# Patient Record
Sex: Male | Born: 1955
Health system: Southern US, Community
[De-identification: ages and names within clinical notes are randomized; demographics above are authoritative.]

## PROBLEM LIST (undated history)

## (undated) DIAGNOSIS — G4733 Obstructive sleep apnea (adult) (pediatric): Secondary | ICD-10-CM

## (undated) DIAGNOSIS — R739 Hyperglycemia, unspecified: Secondary | ICD-10-CM

## (undated) DIAGNOSIS — E785 Hyperlipidemia, unspecified: Secondary | ICD-10-CM

## (undated) HISTORY — PX: TONSILLECTOMY AND ADENOIDECTOMY: SHX28

## (undated) HISTORY — PX: OTHER SURGICAL HISTORY: SHX169

## (undated) HISTORY — DX: Hyperlipidemia, unspecified: E78.5

## (undated) HISTORY — DX: Hyperglycemia, unspecified: R73.9

## (undated) HISTORY — DX: Obstructive sleep apnea (adult) (pediatric): G47.33

---

## 1998-02-27 ENCOUNTER — Emergency Department (HOSPITAL_COMMUNITY): Admission: EM | Admit: 1998-02-27 | Discharge: 1998-02-27 | Payer: Self-pay | Admitting: Family Medicine

## 2005-11-04 ENCOUNTER — Ambulatory Visit (HOSPITAL_COMMUNITY): Admission: RE | Admit: 2005-11-04 | Discharge: 2005-11-04 | Payer: Self-pay | Admitting: Orthopedic Surgery

## 2010-09-21 ENCOUNTER — Ambulatory Visit
Admission: RE | Admit: 2010-09-21 | Discharge: 2010-09-21 | Disposition: A | Discharge status: Home / Self Care | Payer: Self-pay | Source: Ambulatory Visit | Attending: Family Medicine | Admitting: Family Medicine

## 2010-09-21 ENCOUNTER — Other Ambulatory Visit: Payer: Self-pay | Admitting: Family Medicine

## 2010-09-21 DIAGNOSIS — N5089 Other specified disorders of the male genital organs: Secondary | ICD-10-CM

## 2011-04-25 ENCOUNTER — Ambulatory Visit (INDEPENDENT_AMBULATORY_CARE_PROVIDER_SITE_OTHER): Payer: 59 | Admitting: Sports Medicine

## 2011-04-25 ENCOUNTER — Encounter: Payer: Self-pay | Admitting: Sports Medicine

## 2011-04-25 VITALS — HR 73 | Ht 79.0 in | Wt 256.0 lb

## 2011-04-25 DIAGNOSIS — M79671 Pain in right foot: Secondary | ICD-10-CM | POA: Insufficient documentation

## 2011-04-25 DIAGNOSIS — M79609 Pain in unspecified limb: Secondary | ICD-10-CM

## 2011-04-25 MED ORDER — MELOXICAM 15 MG PO TABS: ORAL_TABLET | ORAL | Status: AC

## 2011-04-25 NOTE — Progress Notes (Signed)
  Subjective:    Patient ID: Bryan Medina, male    DOB: July 29, 1955, 55 y.o.   MRN: [id number]  HPI Right foot pain: Localized to the lateral midfoot from the plantar aspect. Noted is approximately 1 month ago while walking immediately felt a pain in this location, was able to continue walking but only with significant pain. Has been icing it as recommended by his PCP, and using ibuprofen on and off.  Walking approximately 2 miles per day. No snaps, pops, missteps noted.  Past medical history: None Past surgical history: None Social history: No alcohol, tobacco, or drugs. As a sales rep, on his feet a lot, and traveling a lot. Family history: Positive for diabetes negative for heart disease or high blood pressure. Allergies: No known drug allergies. Medications: None  Review of Systems    no fevers, chills, night sweats, weight loss. Objective:   Physical Exam Gen: Well-developed well-nourished Caucasian male in no acute distress. Neuro: Alert oriented x3, extraocular muscles intact. Skin: Warm and dry. Respiratory: Not using accessory muscles. Musculoskeletal: Cavus foot bilaterally, transverse arch breakdown bilaterally, abnormal callus noted on second through fourth metatarsal heads. Tender to palpation on the lateral aspect of plantar midfoot. No pain with small or great toe plantar flexion or dorsiflexion. Posterior tib function intact. No pain along the peroneals, or with resisted eversion. Negative anterior drawer, negative Kleiger, negative squeeze.  Leg lengths equal and 110 cm bilaterally.     Assessment & Plan:

## 2011-04-25 NOTE — Assessment & Plan Note (Addendum)
Suspect strain of one of the intrinsic foot muscles, quadratus plantae versus abductor digiti minimi. Continue ice 20 minutes 3 times a day. Meloxicam 15 mg daily. We've given him a sports insole, size 14, with some mediums metatarsal pads, lateral heel wedge on the right side. If he does well with this we'll bring him back and make custom orthotics. He will come back to see [place] in one month.

## 2011-05-23 ENCOUNTER — Ambulatory Visit: Payer: Self-pay | Admitting: Sports Medicine

## 2012-03-26 ENCOUNTER — Ambulatory Visit (INDEPENDENT_AMBULATORY_CARE_PROVIDER_SITE_OTHER): Payer: 59 | Admitting: Sports Medicine

## 2012-03-26 VITALS — BP 114/80 | Ht 79.0 in | Wt 256.0 lb

## 2012-03-26 DIAGNOSIS — M25579 Pain in unspecified ankle and joints of unspecified foot: Secondary | ICD-10-CM

## 2012-03-26 DIAGNOSIS — M79671 Pain in right foot: Secondary | ICD-10-CM

## 2012-03-26 DIAGNOSIS — M25571 Pain in right ankle and joints of right foot: Secondary | ICD-10-CM

## 2012-03-26 DIAGNOSIS — M79609 Pain in unspecified limb: Secondary | ICD-10-CM

## 2012-03-26 NOTE — Patient Instructions (Signed)
Please do suggested exercises daily for your ankle  We will contact [facility] about getting your ankle sleeve  Please make a follow up for custom orthotics- please bring walking shoes  Please get x-ray of your rt ankle  Thank you for seeing [place] today!

## 2012-03-26 NOTE — Assessment & Plan Note (Signed)
Less pure foot pain now More localized to lat ankle

## 2012-03-26 NOTE — Progress Notes (Signed)
Patient ID: Dontez Cioffi, male   DOB: [birth year], 56 y.o.   MRN: [id number]  F/U of RT ankle/foot pain  Saw Dr T last year Came up last sept Pain happened with a turn while walking Pain came on immediately Hard to finish walking and already swollen when home Swollen since last year  Given sports insoles Mobic Tx did not really help No exercises or compression Used some ice    Physical Exam: NAD  Lt ankle: Increased inversion laxity on lt Slightly positive drawer  Rt ankle: Swelling along ant portion of lateral malleolus No tenderness to percussion of malleolus Less laxity on inversion or drawer on rt No ttp AT   Slight hammering of toes bilat with loss of transverse arch High long arch bilat Splaying b/t toes 2-3 on lt piezogenic papules bilat  [place] Irregularity along ant inf lat malleolus RT Probable loose body or spur in area below lat mall No ankle effusion noted Possible lat ant talar dome irregularity Tendons are intact

## 2012-03-26 NOTE — Assessment & Plan Note (Signed)
Trial on compression - will need to order XL from [facility]  Work on easy ROM  Icing at end of day  May benefit from orthotics if not enough support  Xray today to assess for DJD  Reck after 1 month of treatment plan

## 2012-04-15 ENCOUNTER — Ambulatory Visit
Admission: RE | Admit: 2012-04-15 | Discharge: 2012-04-15 | Disposition: A | Discharge status: Home / Self Care | Payer: 59 | Source: Ambulatory Visit | Attending: Sports Medicine | Admitting: Sports Medicine

## 2012-04-15 DIAGNOSIS — M25571 Pain in right ankle and joints of right foot: Secondary | ICD-10-CM

## 2012-04-17 ENCOUNTER — Telehealth: Payer: Self-pay | Admitting: *Deleted

## 2012-04-17 NOTE — Telephone Encounter (Signed)
Spoke with pt- re: his x-rays.  Advised him to schedule f/u if ankle is still painful.  Pt states he will call back to schedule.

## 2012-04-17 NOTE — Telephone Encounter (Signed)
Message copied by Mora Bellman on Wed Apr 17, 2012  4:46 PM ------      Message from: Enid Baas      Created: Wed Apr 17, 2012  4:03 PM       XRay did not show arthritis but an old avulsion fracture. Let him know that based on his XRay and his [place] in our office I would be happy to see him back if the ankle is still bothering him as much.            KBF      ----- Message -----         From: Lizbeth Bark         Sent: 04/17/2012   1:23 PM           To: Enid Baas, MD            He doesn't have one      ----- Message -----         From: Enid Baas, MD         Sent: 04/17/2012  12:41 PM           To: Melanie L Ceresi            Does he have appt?

## 2013-04-03 IMAGING — CR DG ANKLE 2V *R*
2 series · 2 of 2 positions shown · non-contrast
Comparison: None.

CLINICAL DATA: Continued discomfort after a  right ankle injury 1
year ago.

RIGHT ANKLE - 2 VIEW

[view not recorded (1 of 2)]
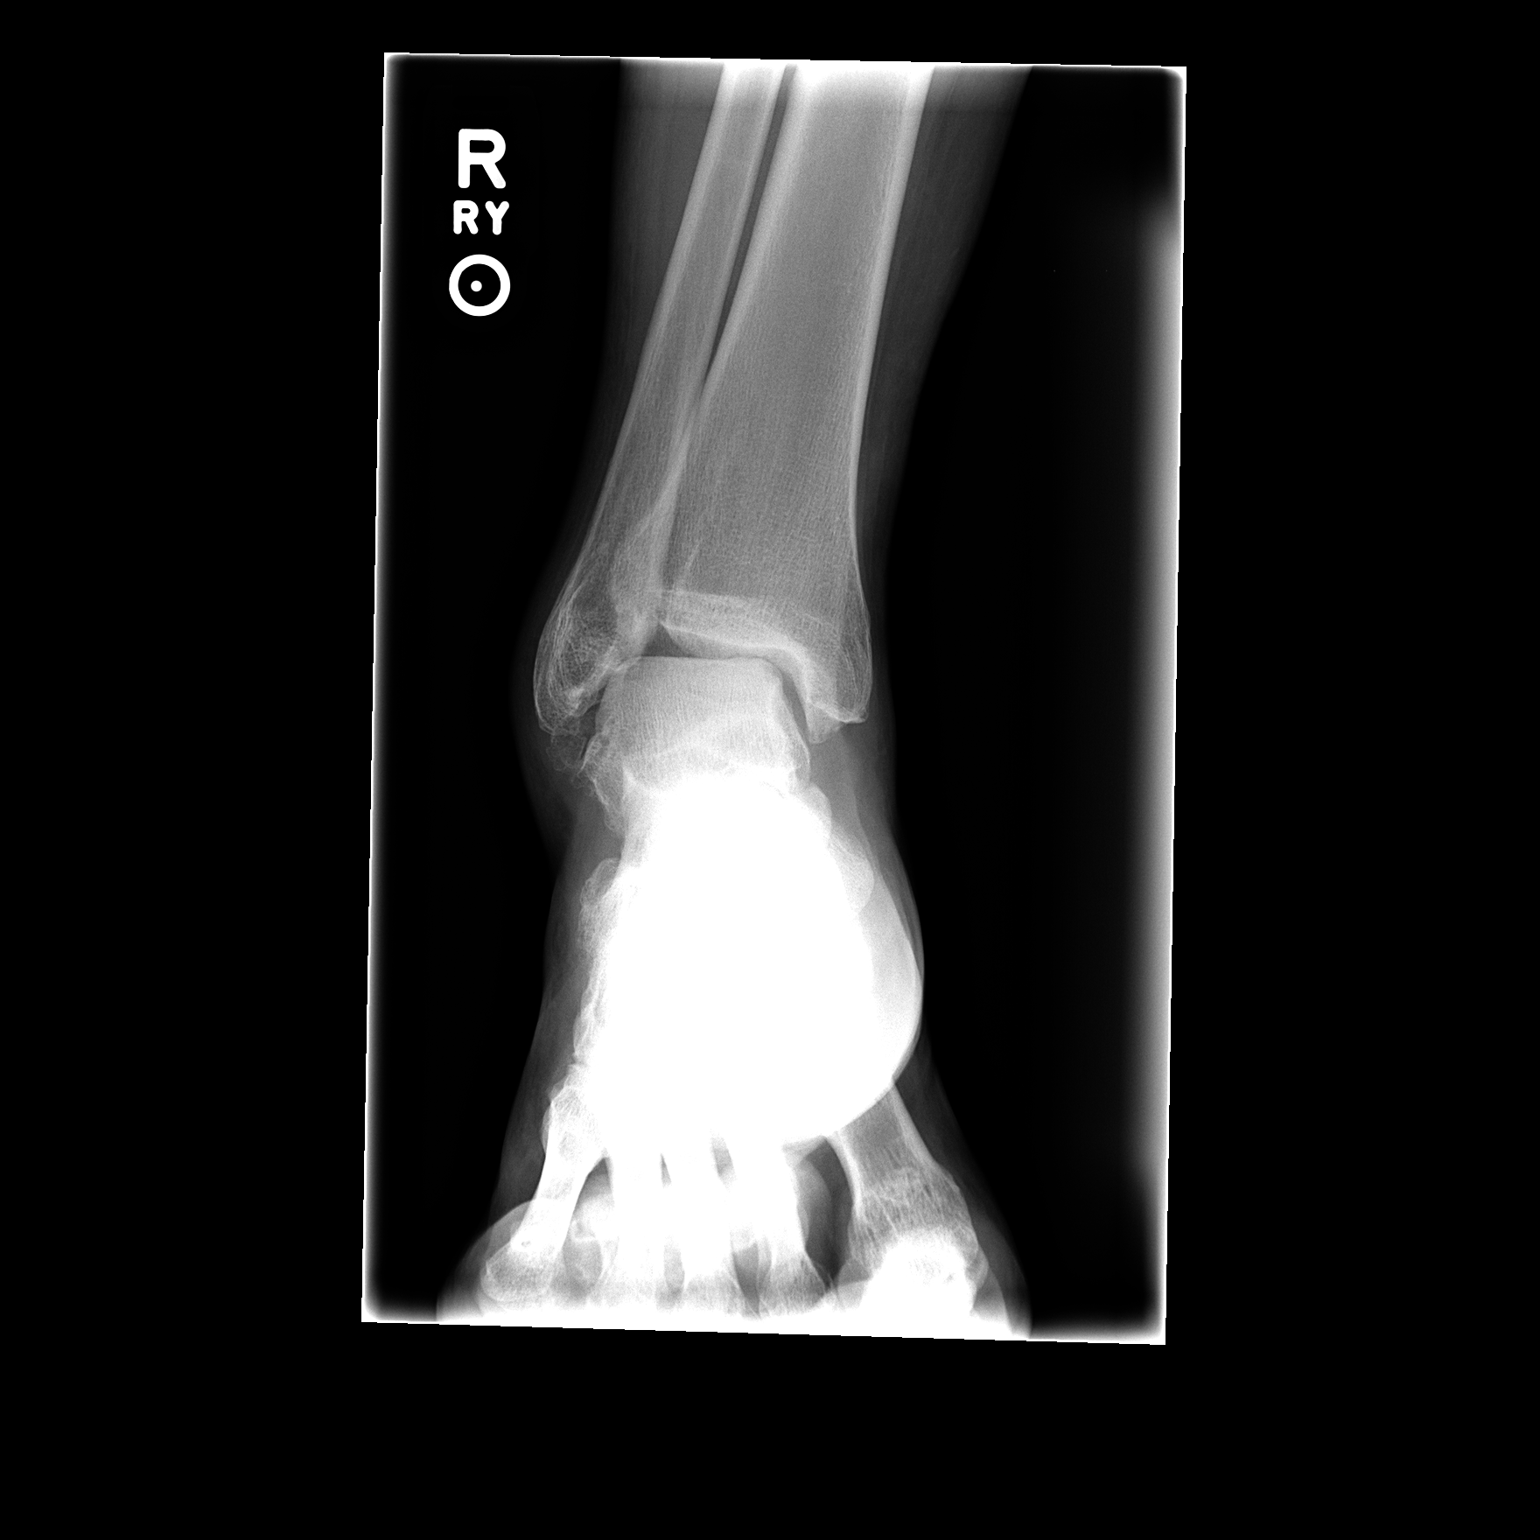

[view not recorded (2 of 2)]
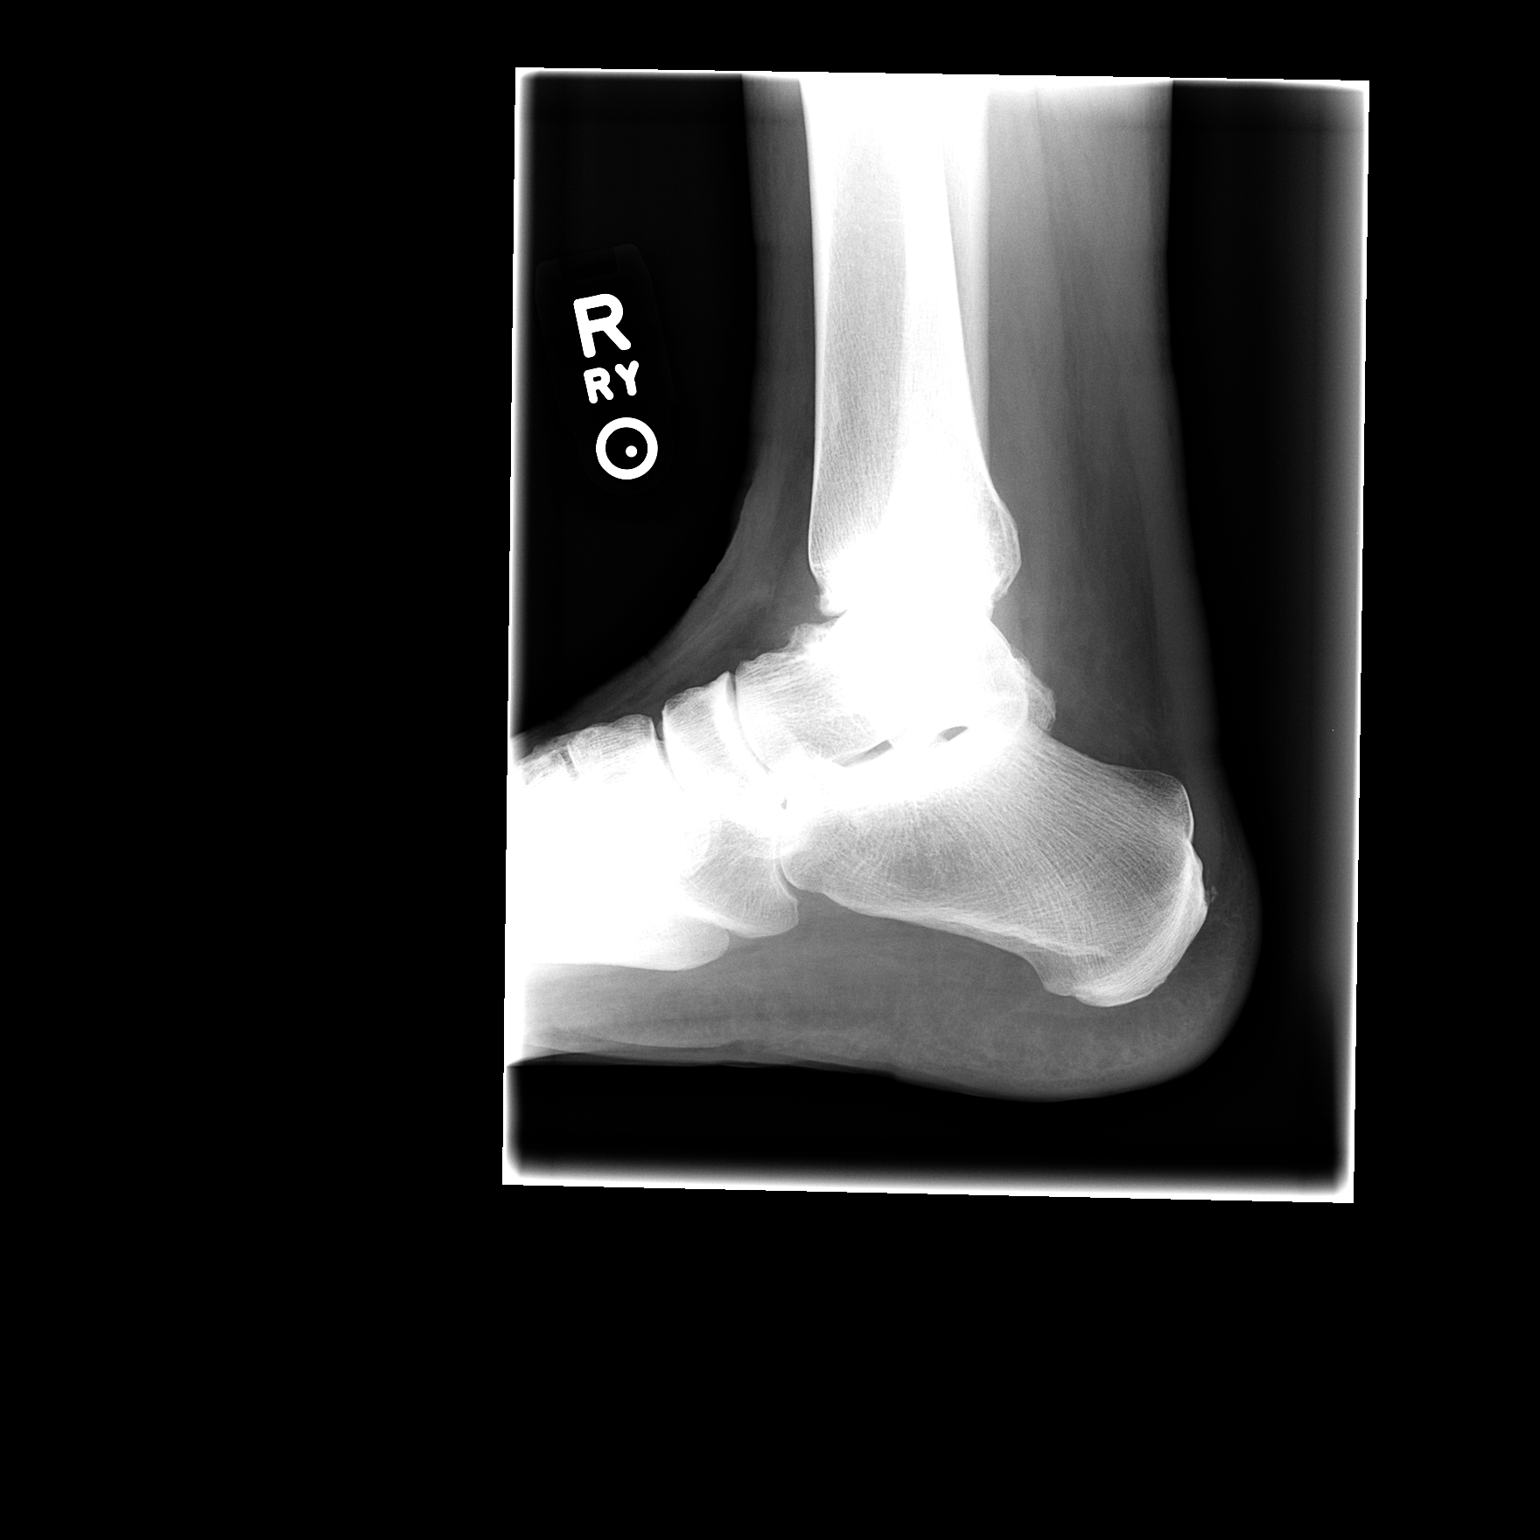

[2 of 2 positions shown; findings below may reference images not displayed]

FINDINGS: There are small rounded densities inferior to the lateral
malleolus suggesting a remote avulsion injury. There is a chronic
ligamentous injury resulting in widening of the ankle mortise
laterally and narrowing medially.  There is slight tilting of the
tibia medially on the talus.  No acute fracture is seen.  Mild
lateral soft tissue swelling is noted.
IMPRESSION: Findings consistent with a chronic  ligamentous injury to the ankle
with  lateral ankle mortise widening.  Suspect old avulsion injury
lateral malleolus.

## 2017-09-25 ENCOUNTER — Encounter: Payer: Self-pay | Admitting: Sports Medicine

## 2017-09-25 ENCOUNTER — Telehealth: Payer: Self-pay | Admitting: *Deleted

## 2017-09-25 ENCOUNTER — Ambulatory Visit (INDEPENDENT_AMBULATORY_CARE_PROVIDER_SITE_OTHER): Payer: 59 | Admitting: Sports Medicine

## 2017-09-25 VITALS — BP 124/82 | HR 70

## 2017-09-25 DIAGNOSIS — M79671 Pain in right foot: Secondary | ICD-10-CM

## 2017-09-25 DIAGNOSIS — M25571 Pain in right ankle and joints of right foot: Secondary | ICD-10-CM | POA: Diagnosis not present

## 2017-09-25 DIAGNOSIS — T8131XA Disruption of external operation (surgical) wound, not elsewhere classified, initial encounter: Secondary | ICD-10-CM | POA: Diagnosis not present

## 2017-09-25 DIAGNOSIS — Z9889 Other specified postprocedural states: Secondary | ICD-10-CM | POA: Diagnosis not present

## 2017-09-25 DIAGNOSIS — L97511 Non-pressure chronic ulcer of other part of right foot limited to breakdown of skin: Secondary | ICD-10-CM

## 2017-09-25 MED ORDER — COLLAGENASE 250 UNIT/GM EX OINT: 1.0000 "application " | TOPICAL_OINTMENT | Freq: Every day | CUTANEOUS | 5 refills | Status: AC

## 2017-09-25 NOTE — Telephone Encounter (Signed)
-----   Message from Landis Martins, [place] sent at 09/25/2017  9:36 AM EDT ----- Regarding: Oren Section for right foot/ankle ulceration measures 2x1cm. Call patient to let him know when the medication has been sent to his pharmacy Thanks Dr. Chauncey Cruel

## 2017-09-25 NOTE — Telephone Encounter (Signed)
Faxed required form and demographics to [facility]. 

## 2017-09-25 NOTE — Patient Instructions (Signed)
Cleanse wound with saline or wound wash (can be purchased over the counter) then apply Santyl and cover with guaze and paper tape or a bandaid dressing daily May leave open to air at night Refrain from getting it wet Follow up with Orthopedic surgeon

## 2017-09-25 NOTE — Progress Notes (Signed)
Subjective: Bryan Medina is a 62 y.o. male patient seen in office for evaluation of ulceration of the Right. Patient has a history of total ankle replacement performed by Dr. Debby Bud on 08/16/17. Reports after cast was removed had wound and later the sutures were removed with last visit on 3/20. Patient reports that he told his PCP Dr. Unk Lightning about the wound who referred him to me for evaluation of the wound at his ankle.   Patient is changing the dressing using antibiotic cream at home. States that he finished PO antibiotic last Tuesday. Denies nausea/fever/vomiting/chills/night sweats/shortness of breath/pain. Patient has no other pedal complaints at this time.  Review of Systems  Skin:       Wound right ankle  All other systems reviewed and are negative.  Patient Active Problem List   Diagnosis Date Noted  . Ankle pain, right 03/26/2012  . Right foot pain 04/25/2011   Current Outpatient Medications on File Prior to Visit  Medication Sig Dispense Refill  . meloxicam (MOBIC) 15 MG tablet One tab PO qAM for 2 weeks, then prn. 30 tablet 2   No current facility-administered medications on file prior to visit.    Allergies  Allergen Reactions  . No Known Allergies     No results found for this or any previous visit (from the past 2160 hour(s)).  Objective: There were no vitals filed for this visit.  General: Patient is awake, alert, oriented x 3 and in no acute distress.  Dermatology: Skin is warm and dry bilateral with a partial thickness ulceration present R anterior ankle. Ulceration measures 2cm x 1cm x 0 cm. There is a scar at borders since wound is within surgical incision line with a fibrotic base. The ulceration does not probe to bone. No tendon or deep tissue exposure. There is no malodor, no active drainage, no erythema, no edema. No acute signs of infection. Mild blanchable skin changes and scar contracture consistent with post op status at right ankle.    Vascular: Dorsalis  Pedis pulse = 1/4 Bilateral,  Posterior Tibial pulse = 1/4 Bilateral,  Capillary Fill Time < 5 seconds  Neurologic: Gross sensation present via light touch.  Musculosketal: No Pain with palpation to ulcerated area at surgical incision on right anterior ankle.  .No results for input(s): GRAMSTAIN, LABORGA in the last 8760 hours.  Assessment and Plan:  Problem List Items Addressed This Visit      Other   Right foot pain   Ankle pain, right    Other Visit Diagnoses    Foot ulceration, right, limited to breakdown of skin ([place])    -  Primary   Wound disruption, post-op, skin, initial encounter       History of arthroplasty of right ankle           -Examined patient and discussed the progression of the wound and treatment alternatives. Educated patient on post surgical wound  -Xray results reviewed from Dr. Chevis Pretty chart notes  - Cleansed ulceration   -Applied antibiotic cream and dry sterile dressing and instructed patient to continue with daily dressings at home consisting of santyl that was ordered for the fibrotic wound; Instructed to start using santyl in place of antibiotic cream covered with dry dressing; recommend to refrain from getting wound wet; Shower with protector and may leave open to air at night - Advised patient to go to the ER or return to office if the wound worsens or if constitutional symptoms are present. -Recommend patient to keep  follow up with Ortho doctor for continued post op care; Re-consult if other issues or problems arise.  Landis Martins, DPM

## 2017-09-30 ENCOUNTER — Encounter: Payer: Self-pay | Admitting: Sports Medicine

## 2019-01-22 DIAGNOSIS — G4733 Obstructive sleep apnea (adult) (pediatric): Secondary | ICD-10-CM | POA: Diagnosis not present

## 2019-01-24 DIAGNOSIS — R7301 Impaired fasting glucose: Secondary | ICD-10-CM | POA: Diagnosis not present

## 2019-01-24 DIAGNOSIS — E782 Mixed hyperlipidemia: Secondary | ICD-10-CM | POA: Diagnosis not present

## 2019-01-24 DIAGNOSIS — Z125 Encounter for screening for malignant neoplasm of prostate: Secondary | ICD-10-CM | POA: Diagnosis not present

## 2019-01-24 DIAGNOSIS — R5383 Other fatigue: Secondary | ICD-10-CM | POA: Diagnosis not present

## 2019-01-29 DIAGNOSIS — R102 Pelvic and perineal pain: Secondary | ICD-10-CM | POA: Diagnosis not present

## 2019-01-29 DIAGNOSIS — Z125 Encounter for screening for malignant neoplasm of prostate: Secondary | ICD-10-CM | POA: Diagnosis not present

## 2019-01-29 DIAGNOSIS — Z0000 Encounter for general adult medical examination without abnormal findings: Secondary | ICD-10-CM | POA: Diagnosis not present

## 2019-01-29 DIAGNOSIS — Z23 Encounter for immunization: Secondary | ICD-10-CM | POA: Diagnosis not present

## 2019-01-29 DIAGNOSIS — Z7189 Other specified counseling: Secondary | ICD-10-CM | POA: Diagnosis not present

## 2019-01-29 DIAGNOSIS — R7301 Impaired fasting glucose: Secondary | ICD-10-CM | POA: Diagnosis not present

## 2019-01-29 DIAGNOSIS — E782 Mixed hyperlipidemia: Secondary | ICD-10-CM | POA: Diagnosis not present

## 2019-02-14 DIAGNOSIS — G4733 Obstructive sleep apnea (adult) (pediatric): Secondary | ICD-10-CM | POA: Diagnosis not present

## 2019-02-15 DIAGNOSIS — Z20828 Contact with and (suspected) exposure to other viral communicable diseases: Secondary | ICD-10-CM | POA: Diagnosis not present

## 2019-02-22 DIAGNOSIS — G4733 Obstructive sleep apnea (adult) (pediatric): Secondary | ICD-10-CM | POA: Diagnosis not present

## 2019-03-25 DIAGNOSIS — G4733 Obstructive sleep apnea (adult) (pediatric): Secondary | ICD-10-CM | POA: Diagnosis not present

## 2019-04-23 DIAGNOSIS — R69 Illness, unspecified: Secondary | ICD-10-CM | POA: Diagnosis not present

## 2019-05-15 DIAGNOSIS — G4733 Obstructive sleep apnea (adult) (pediatric): Secondary | ICD-10-CM | POA: Diagnosis not present

## 2019-05-26 DIAGNOSIS — R69 Illness, unspecified: Secondary | ICD-10-CM | POA: Diagnosis not present

## 2019-06-14 DIAGNOSIS — G4733 Obstructive sleep apnea (adult) (pediatric): Secondary | ICD-10-CM | POA: Diagnosis not present

## 2019-06-25 DIAGNOSIS — H2513 Age-related nuclear cataract, bilateral: Secondary | ICD-10-CM | POA: Diagnosis not present

## 2019-06-25 DIAGNOSIS — H52203 Unspecified astigmatism, bilateral: Secondary | ICD-10-CM | POA: Diagnosis not present

## 2019-07-15 DIAGNOSIS — G4733 Obstructive sleep apnea (adult) (pediatric): Secondary | ICD-10-CM | POA: Diagnosis not present

## 2019-08-18 DIAGNOSIS — M19071 Primary osteoarthritis, right ankle and foot: Secondary | ICD-10-CM | POA: Diagnosis not present

## 2019-08-20 DIAGNOSIS — G4733 Obstructive sleep apnea (adult) (pediatric): Secondary | ICD-10-CM | POA: Diagnosis not present

## 2019-09-03 DIAGNOSIS — R102 Pelvic and perineal pain: Secondary | ICD-10-CM | POA: Diagnosis not present

## 2019-09-03 DIAGNOSIS — N401 Enlarged prostate with lower urinary tract symptoms: Secondary | ICD-10-CM | POA: Diagnosis not present

## 2019-09-03 DIAGNOSIS — R3912 Poor urinary stream: Secondary | ICD-10-CM | POA: Diagnosis not present

## 2019-11-04 DIAGNOSIS — R69 Illness, unspecified: Secondary | ICD-10-CM | POA: Diagnosis not present

## 2020-02-02 DIAGNOSIS — G4733 Obstructive sleep apnea (adult) (pediatric): Secondary | ICD-10-CM | POA: Diagnosis not present

## 2020-02-02 DIAGNOSIS — E782 Mixed hyperlipidemia: Secondary | ICD-10-CM | POA: Diagnosis not present

## 2020-02-02 DIAGNOSIS — Z1159 Encounter for screening for other viral diseases: Secondary | ICD-10-CM | POA: Diagnosis not present

## 2020-02-02 DIAGNOSIS — Z0000 Encounter for general adult medical examination without abnormal findings: Secondary | ICD-10-CM | POA: Diagnosis not present

## 2020-02-02 DIAGNOSIS — R0789 Other chest pain: Secondary | ICD-10-CM | POA: Diagnosis not present

## 2020-02-02 DIAGNOSIS — R7309 Other abnormal glucose: Secondary | ICD-10-CM | POA: Diagnosis not present

## 2020-02-02 DIAGNOSIS — Z125 Encounter for screening for malignant neoplasm of prostate: Secondary | ICD-10-CM | POA: Diagnosis not present

## 2020-03-10 DIAGNOSIS — R3912 Poor urinary stream: Secondary | ICD-10-CM | POA: Diagnosis not present

## 2020-03-10 DIAGNOSIS — N401 Enlarged prostate with lower urinary tract symptoms: Secondary | ICD-10-CM | POA: Diagnosis not present

## 2020-03-17 DIAGNOSIS — D2271 Melanocytic nevi of right lower limb, including hip: Secondary | ICD-10-CM | POA: Diagnosis not present

## 2020-03-17 DIAGNOSIS — L570 Actinic keratosis: Secondary | ICD-10-CM | POA: Diagnosis not present

## 2020-03-17 DIAGNOSIS — D2272 Melanocytic nevi of left lower limb, including hip: Secondary | ICD-10-CM | POA: Diagnosis not present

## 2020-03-17 DIAGNOSIS — D2261 Melanocytic nevi of right upper limb, including shoulder: Secondary | ICD-10-CM | POA: Diagnosis not present

## 2020-03-17 DIAGNOSIS — D2262 Melanocytic nevi of left upper limb, including shoulder: Secondary | ICD-10-CM | POA: Diagnosis not present

## 2020-03-17 DIAGNOSIS — L918 Other hypertrophic disorders of the skin: Secondary | ICD-10-CM | POA: Diagnosis not present

## 2020-03-17 DIAGNOSIS — D225 Melanocytic nevi of trunk: Secondary | ICD-10-CM | POA: Diagnosis not present

## 2020-03-17 DIAGNOSIS — L821 Other seborrheic keratosis: Secondary | ICD-10-CM | POA: Diagnosis not present

## 2020-03-17 DIAGNOSIS — D485 Neoplasm of uncertain behavior of skin: Secondary | ICD-10-CM | POA: Diagnosis not present

## 2020-03-30 NOTE — Progress Notes (Signed)
Cardiology Office Note   Date:  04/01/2020   ID:  Bryan Medina, DOB Aug 08, 1955, MRN [id number]  PCP:  Mayra Neer, MD  Cardiologist:   No primary care provider on file. Referring:  Mayra Neer, MD  Chief Complaint  Patient presents with  . Chest Pain      History of Present Illness: Bryan Medina is a 64 y.o. male who was sent by Mayra Neer, MD for evaluation of chest pain.   He has no past cardiac history but he does have risk factors.     He has been having some chest discomfort.  This happens sporadically.  It might happen at rest.  It might happen with activity such as moving his youngest daughter into a new house.  He says it is more of a fleeting discomfort.  He points to his left upper chest.  It is mild.  There is no radiation to his arm or to his jaw.  It comes and goes spontaneously.  It seems to be a somewhat stable pattern but he had significant cardiovascular risk factors as below.  He does not have associated nausea vomiting or diaphoresis.  He does not have any significant shortness of breath, PND or orthopnea.  He has some water aerobics and some walking for exercise.  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  Past Medical History:  Diagnosis Date  . Dyslipidemia   . Hyperglycemia   . Obstructive sleep apnea    Uses CPAP    Past Surgical History:  Procedure Laterality Date  . Right ankle replacement    . TONSILLECTOMY AND ADENOIDECTOMY       Current Outpatient Medications  Medication Sig Dispense Refill  . collagenase (SANTYL) ointment Apply 1 application topically daily. 15 g 5  . meloxicam (MOBIC) 15 MG tablet One tab PO qAM for 2 weeks, then prn. 30 tablet 2   No current facility-administered medications for this visit.    Allergies:   No known allergies    Social History:  The patient  reports that he has never smoked. He has never  used smokeless tobacco.   Family History:  The patient's family history includes Diabetes in his mother.   No early heart disease in the family.     ROS:  Please see the history of present illness.   Otherwise, review of systems are positive for none.   All other systems are reviewed and negative.    PHYSICAL EXAM: VS:  BP 108/68 (BP Location: Right Arm)   Pulse 77   Ht 6\' 7"  (2.007 m)   Wt 263 lb (119.3 kg)   SpO2 97%   BMI 29.63 kg/m  , BMI Body mass index is 29.63 kg/m. GENERAL:  Well appearing HEENT:  Pupils equal round and reactive, fundi not visualized, oral mucosa unremarkable NECK:  No jugular venous distention, waveform within normal limits, carotid upstroke brisk and symmetric, no bruits, no thyromegaly LYMPHATICS:  No cervical, inguinal adenopathy LUNGS:  Clear to auscultation bilaterally BACK:  No CVA tenderness CHEST:  Unremarkable HEART:  PMI not displaced or sustained,S1 and S2 within normal limits, no S3, no S4, no clicks, no rubs, no murmurs ABD:  Flat, positive bowel sounds normal in frequency in pitch, no bruits, no rebound, no guarding, no midline pulsatile mass, no hepatomegaly, no splenomegaly EXT:  2 plus pulses throughout, no edema, no cyanosis  no clubbing SKIN:  No rashes no nodules NEURO:  Cranial nerves II through XII grossly intact, motor grossly intact throughout PSYCH:  Cognitively intact, oriented to person place and time    EKG:  EKG is not ordered today. The ekg ordered 8-21 demonstrates normal sinus rhythm, rate 58, axis within normal limits, intervals within normal limits, no acute ST-T wave changes.   Recent Labs: No results found for requested labs within last 8760 hours.    Lipid Panel No results found for: CHOL, TRIG, HDL, CHOLHDL, VLDL, LDLCALC, LDLDIRECT    Wt Readings from Last 3 Encounters:  04/01/20 263 lb (119.3 kg)  03/26/12 256 lb (116.1 kg)  04/25/11 256 lb (116.1 kg)      Other studies Reviewed: Additional studies/  records that were reviewed today include: Labs, EKG. Review of the above records demonstrates:  Please see elsewhere in the note.     ASSESSMENT AND PLAN:  CHEST PAIN:   The patient's chest pain is somewhat atypical.  I like to start with a coronary calcium score and a POET (Plain Old Exercise Treadmill).  This will allow me to set goals of therapy and screen for obstructive coronary disease.  If he has a negative treadmill test and no further testing would be indicated but he might need further therapy for risk reduction pending his calcium score.  HTN: His blood pressure is typically well controlled.  No change in therapy.  DYSLIPIDEMIA: LDL was 146.  Total cholesterol 219.  Triglycerides 114.  I will use the MESA score for goals of therapy.  COVID EDUCATION: He has had the virus.  We talked at length about the benefits of the vaccine.  Current medicines are reviewed at length with the patient today.  The patient does not have concerns regarding medicines.  The following changes have been made:  no change  Labs/ tests ordered today include:   Orders Placed This Encounter  Procedures  . CT CARDIAC SCORING  . EXERCISE TOLERANCE TEST (ETT)  . EKG 12-Lead     Disposition:   FU with me as needed   Signed, Minus Breeding, MD  04/01/2020 12:39 PM    [place]

## 2020-04-01 ENCOUNTER — Other Ambulatory Visit: Payer: Self-pay

## 2020-04-01 ENCOUNTER — Encounter: Payer: Self-pay | Admitting: Cardiology

## 2020-04-01 ENCOUNTER — Ambulatory Visit (INDEPENDENT_AMBULATORY_CARE_PROVIDER_SITE_OTHER): Payer: Medicare HMO | Admitting: Cardiology

## 2020-04-01 VITALS — BP 108/68 | HR 77 | Ht 79.0 in | Wt 263.0 lb

## 2020-04-01 DIAGNOSIS — R079 Chest pain, unspecified: Secondary | ICD-10-CM | POA: Diagnosis not present

## 2020-04-01 DIAGNOSIS — E785 Hyperlipidemia, unspecified: Secondary | ICD-10-CM

## 2020-04-01 NOTE — Patient Instructions (Addendum)
Medication Instructions:  Your physician recommends that you continue on your current medications as directed. Please refer to the Current Medication list given to you today.  Testing/Procedures: Your physician has requested that you have an exercise tolerance test. For further information please visit [email or web address]. Please also follow instruction sheet, as given. --you will need a Covid test 3 days prior, we will schedule this for you.  CT coronary calcium score. This test is done at [address]. This is $150 out of pocket.   Coronary CalciumScan A coronary calcium scan is an imaging test used to look for deposits of calcium and other fatty materials (plaques) in the inner lining of the blood vessels of the heart (coronary arteries). These deposits of calcium and plaques can partly clog and narrow the coronary arteries without producing any symptoms or warning signs. This puts a person at risk for a heart attack. This test can detect these deposits before symptoms develop. Tell a health care provider about:  Any allergies you have.  All medicines you are taking, including vitamins, herbs, eye drops, creams, and over-the-counter medicines.  Any problems you or family members have had with anesthetic medicines.  Any blood disorders you have.  Any surgeries you have had.  Any medical conditions you have.  Whether you are pregnant or may be pregnant. What are the risks? Generally, this is a safe procedure. However, problems may occur, including:  Harm to a pregnant woman and her unborn baby. This test involves the use of radiation. Radiation exposure can be dangerous to a pregnant woman and her unborn baby. If you are pregnant, you generally should not have this procedure done.  Slight increase in the risk of cancer. This is because of the radiation involved in the test. What happens before the procedure? No preparation is needed for this procedure. What  happens during the procedure?  You will undress and remove any jewelry around your neck or chest.  You will put on a hospital gown.  Sticky electrodes will be placed on your chest. The electrodes will be connected to an electrocardiogram (ECG) machine to record a tracing of the electrical activity of your heart.  A CT scanner will take pictures of your heart. During this time, you will be asked to lie still and hold your breath for 2-3 seconds while a picture of your heart is being taken. The procedure may vary among health care providers and hospitals. What happens after the procedure?  You can get dressed.  You can return to your normal activities.  It is up to you to get the results of your test. Ask your health care provider, or the department that is doing the test, when your results will be ready. Summary  A coronary calcium scan is an imaging test used to look for deposits of calcium and other fatty materials (plaques) in the inner lining of the blood vessels of the heart (coronary arteries).  Generally, this is a safe procedure. Tell your health care provider if you are pregnant or may be pregnant.  No preparation is needed for this procedure.  A CT scanner will take pictures of your heart.  You can return to your normal activities after the scan is done. This information is not intended to replace advice given to you by your health care provider. Make sure you discuss any questions you have with your health care provider. Document Released: 12/16/2007 Document Revised: 05/08/2016 Document Reviewed: 05/08/2016 Elsevier Interactive Patient Education  2017 [place], you and your health needs are our priority.  As part of our continuing mission to provide you with exceptional heart care, we have created designated Provider Care Teams.  These Care Teams include your primary Cardiologist (physician) and Advanced Practice Providers (APPs -   Physician Assistants and Nurse Practitioners) who all work together to provide you with the care you need, when you need it.  We recommend signing up for the patient portal called "MyChart".  Sign up information is provided on this After Visit Summary.  MyChart is used to connect with patients for Virtual Visits (Telemedicine).  Patients are able to view lab/test results, encounter notes, upcoming appointments, etc.  Non-urgent messages can be sent to your provider as well.   To learn more about what you can do with MyChart, go to [email or web address].    Your next appointment:   AS NEEDED with Dr. Percival Spanish

## 2020-04-26 ENCOUNTER — Inpatient Hospital Stay (HOSPITAL_COMMUNITY): Admission: RE | Admit: 2020-04-26 | Payer: Medicare HMO | Source: Ambulatory Visit

## 2020-04-29 ENCOUNTER — Inpatient Hospital Stay: Admission: RE | Admit: 2020-04-29 | Payer: Medicare HMO | Source: Ambulatory Visit

## 2020-04-29 ENCOUNTER — Ambulatory Visit (HOSPITAL_COMMUNITY)
Admission: RE | Admit: 2020-04-29 | Payer: Medicare HMO | Source: Ambulatory Visit | Attending: Cardiology | Admitting: Cardiology

## 2020-05-28 ENCOUNTER — Other Ambulatory Visit (HOSPITAL_COMMUNITY)
Admission: RE | Admit: 2020-05-28 | Discharge: 2020-05-28 | Disposition: A | Discharge status: Home / Self Care | Payer: Medicare HMO | Source: Ambulatory Visit | Attending: Cardiology | Admitting: Cardiology

## 2020-05-28 DIAGNOSIS — Z20822 Contact with and (suspected) exposure to covid-19: Secondary | ICD-10-CM | POA: Insufficient documentation

## 2020-05-28 DIAGNOSIS — Z01812 Encounter for preprocedural laboratory examination: Secondary | ICD-10-CM | POA: Insufficient documentation

## 2020-05-28 LAB — SARS CORONAVIRUS 2 (TAT 6-24 HRS): SARS Coronavirus 2: NEGATIVE

## 2020-06-01 ENCOUNTER — Ambulatory Visit (INDEPENDENT_AMBULATORY_CARE_PROVIDER_SITE_OTHER): Payer: Medicare HMO

## 2020-06-01 ENCOUNTER — Other Ambulatory Visit: Payer: Self-pay

## 2020-06-01 DIAGNOSIS — R079 Chest pain, unspecified: Secondary | ICD-10-CM

## 2020-06-01 LAB — EXERCISE TOLERANCE TEST
Estimated workload: 7 METS
Exercise duration (min): 6 min
Exercise duration (sec): 0 s
MPHR: 154 {beats}/min
Peak HR: 155 {beats}/min
Percent HR: 100 %
RPE: 15
Rest HR: 60 {beats}/min

## 2020-07-22 DIAGNOSIS — G4733 Obstructive sleep apnea (adult) (pediatric): Secondary | ICD-10-CM | POA: Diagnosis not present

## 2020-07-30 DIAGNOSIS — H524 Presbyopia: Secondary | ICD-10-CM | POA: Diagnosis not present

## 2020-07-30 DIAGNOSIS — D3131 Benign neoplasm of right choroid: Secondary | ICD-10-CM | POA: Diagnosis not present

## 2020-08-18 DIAGNOSIS — M19071 Primary osteoarthritis, right ankle and foot: Secondary | ICD-10-CM | POA: Diagnosis not present

## 2020-08-22 DIAGNOSIS — G4733 Obstructive sleep apnea (adult) (pediatric): Secondary | ICD-10-CM | POA: Diagnosis not present

## 2020-08-30 DIAGNOSIS — G4733 Obstructive sleep apnea (adult) (pediatric): Secondary | ICD-10-CM | POA: Diagnosis not present

## 2020-09-19 DIAGNOSIS — G4733 Obstructive sleep apnea (adult) (pediatric): Secondary | ICD-10-CM | POA: Diagnosis not present

## 2021-01-26 DIAGNOSIS — R079 Chest pain, unspecified: Secondary | ICD-10-CM | POA: Diagnosis not present

## 2021-02-08 DIAGNOSIS — G4733 Obstructive sleep apnea (adult) (pediatric): Secondary | ICD-10-CM | POA: Diagnosis not present

## 2021-02-10 DIAGNOSIS — M7989 Other specified soft tissue disorders: Secondary | ICD-10-CM | POA: Diagnosis not present

## 2021-02-10 DIAGNOSIS — Z96661 Presence of right artificial ankle joint: Secondary | ICD-10-CM | POA: Diagnosis not present

## 2021-02-10 DIAGNOSIS — M25571 Pain in right ankle and joints of right foot: Secondary | ICD-10-CM | POA: Diagnosis not present

## 2021-02-10 DIAGNOSIS — M216X1 Other acquired deformities of right foot: Secondary | ICD-10-CM | POA: Diagnosis not present

## 2021-02-16 DIAGNOSIS — M85671 Other cyst of bone, right ankle and foot: Secondary | ICD-10-CM | POA: Diagnosis not present

## 2021-02-16 DIAGNOSIS — Z96661 Presence of right artificial ankle joint: Secondary | ICD-10-CM | POA: Diagnosis not present

## 2021-02-16 DIAGNOSIS — M216X1 Other acquired deformities of right foot: Secondary | ICD-10-CM | POA: Diagnosis not present

## 2021-02-21 DIAGNOSIS — Z125 Encounter for screening for malignant neoplasm of prostate: Secondary | ICD-10-CM | POA: Diagnosis not present

## 2021-02-21 DIAGNOSIS — R7301 Impaired fasting glucose: Secondary | ICD-10-CM | POA: Diagnosis not present

## 2021-02-21 DIAGNOSIS — G4733 Obstructive sleep apnea (adult) (pediatric): Secondary | ICD-10-CM | POA: Diagnosis not present

## 2021-02-21 DIAGNOSIS — E782 Mixed hyperlipidemia: Secondary | ICD-10-CM | POA: Diagnosis not present

## 2021-02-21 DIAGNOSIS — M85679 Other cyst of bone, unspecified ankle and foot: Secondary | ICD-10-CM | POA: Diagnosis not present

## 2021-02-21 DIAGNOSIS — Z0000 Encounter for general adult medical examination without abnormal findings: Secondary | ICD-10-CM | POA: Diagnosis not present

## 2021-03-11 DIAGNOSIS — G4733 Obstructive sleep apnea (adult) (pediatric): Secondary | ICD-10-CM | POA: Diagnosis not present

## 2021-04-10 DIAGNOSIS — G4733 Obstructive sleep apnea (adult) (pediatric): Secondary | ICD-10-CM | POA: Diagnosis not present

## 2021-05-10 DIAGNOSIS — M5136 Other intervertebral disc degeneration, lumbar region: Secondary | ICD-10-CM | POA: Diagnosis not present

## 2021-05-10 DIAGNOSIS — M9903 Segmental and somatic dysfunction of lumbar region: Secondary | ICD-10-CM | POA: Diagnosis not present

## 2021-05-11 DIAGNOSIS — M5136 Other intervertebral disc degeneration, lumbar region: Secondary | ICD-10-CM | POA: Diagnosis not present

## 2021-05-11 DIAGNOSIS — M9903 Segmental and somatic dysfunction of lumbar region: Secondary | ICD-10-CM | POA: Diagnosis not present

## 2021-05-19 DIAGNOSIS — M9903 Segmental and somatic dysfunction of lumbar region: Secondary | ICD-10-CM | POA: Diagnosis not present

## 2021-05-19 DIAGNOSIS — M5136 Other intervertebral disc degeneration, lumbar region: Secondary | ICD-10-CM | POA: Diagnosis not present

## 2021-05-23 DIAGNOSIS — Z96661 Presence of right artificial ankle joint: Secondary | ICD-10-CM | POA: Diagnosis not present

## 2021-05-23 DIAGNOSIS — M19071 Primary osteoarthritis, right ankle and foot: Secondary | ICD-10-CM | POA: Diagnosis not present

## 2021-05-23 DIAGNOSIS — M216X1 Other acquired deformities of right foot: Secondary | ICD-10-CM | POA: Diagnosis not present

## 2021-05-31 DIAGNOSIS — M5136 Other intervertebral disc degeneration, lumbar region: Secondary | ICD-10-CM | POA: Diagnosis not present

## 2021-05-31 DIAGNOSIS — M9903 Segmental and somatic dysfunction of lumbar region: Secondary | ICD-10-CM | POA: Diagnosis not present

## 2021-06-07 DIAGNOSIS — M5136 Other intervertebral disc degeneration, lumbar region: Secondary | ICD-10-CM | POA: Diagnosis not present

## 2021-06-07 DIAGNOSIS — M9903 Segmental and somatic dysfunction of lumbar region: Secondary | ICD-10-CM | POA: Diagnosis not present

## 2021-07-14 DIAGNOSIS — D2272 Melanocytic nevi of left lower limb, including hip: Secondary | ICD-10-CM | POA: Diagnosis not present

## 2021-07-14 DIAGNOSIS — L918 Other hypertrophic disorders of the skin: Secondary | ICD-10-CM | POA: Diagnosis not present

## 2021-07-14 DIAGNOSIS — L570 Actinic keratosis: Secondary | ICD-10-CM | POA: Diagnosis not present

## 2021-07-14 DIAGNOSIS — L814 Other melanin hyperpigmentation: Secondary | ICD-10-CM | POA: Diagnosis not present

## 2021-07-14 DIAGNOSIS — D2262 Melanocytic nevi of left upper limb, including shoulder: Secondary | ICD-10-CM | POA: Diagnosis not present

## 2021-07-14 DIAGNOSIS — D2271 Melanocytic nevi of right lower limb, including hip: Secondary | ICD-10-CM | POA: Diagnosis not present

## 2021-07-14 DIAGNOSIS — D1801 Hemangioma of skin and subcutaneous tissue: Secondary | ICD-10-CM | POA: Diagnosis not present

## 2021-07-14 DIAGNOSIS — D225 Melanocytic nevi of trunk: Secondary | ICD-10-CM | POA: Diagnosis not present

## 2021-07-14 DIAGNOSIS — L821 Other seborrheic keratosis: Secondary | ICD-10-CM | POA: Diagnosis not present

## 2021-08-09 DIAGNOSIS — H2513 Age-related nuclear cataract, bilateral: Secondary | ICD-10-CM | POA: Diagnosis not present

## 2021-08-09 DIAGNOSIS — H52203 Unspecified astigmatism, bilateral: Secondary | ICD-10-CM | POA: Diagnosis not present

## 2021-08-31 DIAGNOSIS — M216X1 Other acquired deformities of right foot: Secondary | ICD-10-CM | POA: Diagnosis not present

## 2021-08-31 DIAGNOSIS — M19071 Primary osteoarthritis, right ankle and foot: Secondary | ICD-10-CM | POA: Diagnosis not present

## 2021-08-31 DIAGNOSIS — Z96661 Presence of right artificial ankle joint: Secondary | ICD-10-CM | POA: Diagnosis not present

## 2021-09-21 DIAGNOSIS — G4733 Obstructive sleep apnea (adult) (pediatric): Secondary | ICD-10-CM | POA: Diagnosis not present

## 2021-09-28 DIAGNOSIS — G4733 Obstructive sleep apnea (adult) (pediatric): Secondary | ICD-10-CM | POA: Diagnosis not present

## 2021-10-29 DIAGNOSIS — G4733 Obstructive sleep apnea (adult) (pediatric): Secondary | ICD-10-CM | POA: Diagnosis not present

## 2021-11-28 DIAGNOSIS — G4733 Obstructive sleep apnea (adult) (pediatric): Secondary | ICD-10-CM | POA: Diagnosis not present

## 2021-12-21 DIAGNOSIS — M5136 Other intervertebral disc degeneration, lumbar region: Secondary | ICD-10-CM | POA: Diagnosis not present

## 2021-12-21 DIAGNOSIS — M9903 Segmental and somatic dysfunction of lumbar region: Secondary | ICD-10-CM | POA: Diagnosis not present

## 2021-12-27 DIAGNOSIS — M2042 Other hammer toe(s) (acquired), left foot: Secondary | ICD-10-CM | POA: Diagnosis not present

## 2021-12-27 DIAGNOSIS — M24575 Contracture, left foot: Secondary | ICD-10-CM | POA: Diagnosis not present

## 2021-12-27 DIAGNOSIS — M24574 Contracture, right foot: Secondary | ICD-10-CM | POA: Diagnosis not present

## 2021-12-27 DIAGNOSIS — L602 Onychogryphosis: Secondary | ICD-10-CM | POA: Diagnosis not present

## 2021-12-27 DIAGNOSIS — B351 Tinea unguium: Secondary | ICD-10-CM | POA: Diagnosis not present

## 2022-01-30 DIAGNOSIS — Z1211 Encounter for screening for malignant neoplasm of colon: Secondary | ICD-10-CM | POA: Diagnosis not present

## 2022-02-28 DIAGNOSIS — K635 Polyp of colon: Secondary | ICD-10-CM | POA: Diagnosis not present

## 2022-02-28 DIAGNOSIS — D125 Benign neoplasm of sigmoid colon: Secondary | ICD-10-CM | POA: Diagnosis not present

## 2022-02-28 DIAGNOSIS — Z86010 Personal history of colonic polyps: Secondary | ICD-10-CM | POA: Diagnosis not present

## 2022-03-03 DIAGNOSIS — Z125 Encounter for screening for malignant neoplasm of prostate: Secondary | ICD-10-CM | POA: Diagnosis not present

## 2022-03-03 DIAGNOSIS — M85679 Other cyst of bone, unspecified ankle and foot: Secondary | ICD-10-CM | POA: Diagnosis not present

## 2022-03-03 DIAGNOSIS — G4733 Obstructive sleep apnea (adult) (pediatric): Secondary | ICD-10-CM | POA: Diagnosis not present

## 2022-03-03 DIAGNOSIS — Z0000 Encounter for general adult medical examination without abnormal findings: Secondary | ICD-10-CM | POA: Diagnosis not present

## 2022-03-03 DIAGNOSIS — E782 Mixed hyperlipidemia: Secondary | ICD-10-CM | POA: Diagnosis not present

## 2022-03-03 DIAGNOSIS — R7301 Impaired fasting glucose: Secondary | ICD-10-CM | POA: Diagnosis not present

## 2022-03-27 DIAGNOSIS — H90A32 Mixed conductive and sensorineural hearing loss, unilateral, left ear with restricted hearing on the contralateral side: Secondary | ICD-10-CM | POA: Diagnosis not present

## 2022-06-14 DIAGNOSIS — G4733 Obstructive sleep apnea (adult) (pediatric): Secondary | ICD-10-CM | POA: Diagnosis not present

## 2022-07-15 DIAGNOSIS — G4733 Obstructive sleep apnea (adult) (pediatric): Secondary | ICD-10-CM | POA: Diagnosis not present

## 2022-08-15 DIAGNOSIS — G4733 Obstructive sleep apnea (adult) (pediatric): Secondary | ICD-10-CM | POA: Diagnosis not present

## 2022-08-15 DIAGNOSIS — L820 Inflamed seborrheic keratosis: Secondary | ICD-10-CM | POA: Diagnosis not present

## 2022-08-15 DIAGNOSIS — D485 Neoplasm of uncertain behavior of skin: Secondary | ICD-10-CM | POA: Diagnosis not present

## 2022-08-15 DIAGNOSIS — L814 Other melanin hyperpigmentation: Secondary | ICD-10-CM | POA: Diagnosis not present

## 2022-08-15 DIAGNOSIS — L578 Other skin changes due to chronic exposure to nonionizing radiation: Secondary | ICD-10-CM | POA: Diagnosis not present

## 2022-08-15 DIAGNOSIS — X32XXXS Exposure to sunlight, sequela: Secondary | ICD-10-CM | POA: Diagnosis not present

## 2022-08-18 DIAGNOSIS — H2513 Age-related nuclear cataract, bilateral: Secondary | ICD-10-CM | POA: Diagnosis not present

## 2022-08-18 DIAGNOSIS — H52203 Unspecified astigmatism, bilateral: Secondary | ICD-10-CM | POA: Diagnosis not present

## 2022-09-19 DIAGNOSIS — H61191 Noninfective disorders of pinna, right ear: Secondary | ICD-10-CM | POA: Diagnosis not present

## 2022-09-19 DIAGNOSIS — L578 Other skin changes due to chronic exposure to nonionizing radiation: Secondary | ICD-10-CM | POA: Diagnosis not present

## 2022-09-19 DIAGNOSIS — L821 Other seborrheic keratosis: Secondary | ICD-10-CM | POA: Diagnosis not present

## 2022-09-19 DIAGNOSIS — D225 Melanocytic nevi of trunk: Secondary | ICD-10-CM | POA: Diagnosis not present

## 2022-09-19 DIAGNOSIS — L814 Other melanin hyperpigmentation: Secondary | ICD-10-CM | POA: Diagnosis not present

## 2022-09-19 DIAGNOSIS — L570 Actinic keratosis: Secondary | ICD-10-CM | POA: Diagnosis not present

## 2022-09-19 DIAGNOSIS — L905 Scar conditions and fibrosis of skin: Secondary | ICD-10-CM | POA: Diagnosis not present

## 2022-09-25 DIAGNOSIS — M25571 Pain in right ankle and joints of right foot: Secondary | ICD-10-CM | POA: Diagnosis not present

## 2022-09-25 DIAGNOSIS — M79671 Pain in right foot: Secondary | ICD-10-CM | POA: Diagnosis not present

## 2022-09-25 DIAGNOSIS — M2042 Other hammer toe(s) (acquired), left foot: Secondary | ICD-10-CM | POA: Diagnosis not present

## 2022-09-25 DIAGNOSIS — M216X1 Other acquired deformities of right foot: Secondary | ICD-10-CM | POA: Diagnosis not present

## 2022-10-30 DIAGNOSIS — M85671 Other cyst of bone, right ankle and foot: Secondary | ICD-10-CM | POA: Diagnosis not present

## 2022-10-30 DIAGNOSIS — Z471 Aftercare following joint replacement surgery: Secondary | ICD-10-CM | POA: Diagnosis not present

## 2022-10-30 DIAGNOSIS — M19071 Primary osteoarthritis, right ankle and foot: Secondary | ICD-10-CM | POA: Diagnosis not present

## 2022-10-30 DIAGNOSIS — Z96661 Presence of right artificial ankle joint: Secondary | ICD-10-CM | POA: Diagnosis not present

## 2023-03-12 DIAGNOSIS — G4733 Obstructive sleep apnea (adult) (pediatric): Secondary | ICD-10-CM | POA: Diagnosis not present

## 2023-03-12 DIAGNOSIS — Z0000 Encounter for general adult medical examination without abnormal findings: Secondary | ICD-10-CM | POA: Diagnosis not present

## 2023-03-12 DIAGNOSIS — E782 Mixed hyperlipidemia: Secondary | ICD-10-CM | POA: Diagnosis not present

## 2023-03-12 DIAGNOSIS — M85679 Other cyst of bone, unspecified ankle and foot: Secondary | ICD-10-CM | POA: Diagnosis not present

## 2023-03-12 DIAGNOSIS — R7301 Impaired fasting glucose: Secondary | ICD-10-CM | POA: Diagnosis not present

## 2023-03-12 DIAGNOSIS — Z9989 Dependence on other enabling machines and devices: Secondary | ICD-10-CM | POA: Diagnosis not present

## 2023-03-12 DIAGNOSIS — Z125 Encounter for screening for malignant neoplasm of prostate: Secondary | ICD-10-CM | POA: Diagnosis not present

## 2023-03-21 DIAGNOSIS — G4733 Obstructive sleep apnea (adult) (pediatric): Secondary | ICD-10-CM | POA: Diagnosis not present

## 2023-04-30 DIAGNOSIS — M25571 Pain in right ankle and joints of right foot: Secondary | ICD-10-CM | POA: Diagnosis not present

## 2023-04-30 DIAGNOSIS — M85669 Other cyst of bone, unspecified lower leg: Secondary | ICD-10-CM | POA: Diagnosis not present

## 2023-04-30 DIAGNOSIS — Z96661 Presence of right artificial ankle joint: Secondary | ICD-10-CM | POA: Diagnosis not present

## 2023-05-08 DIAGNOSIS — M25562 Pain in left knee: Secondary | ICD-10-CM | POA: Diagnosis not present

## 2023-05-08 DIAGNOSIS — M1712 Unilateral primary osteoarthritis, left knee: Secondary | ICD-10-CM | POA: Diagnosis not present

## 2023-05-25 DIAGNOSIS — M1712 Unilateral primary osteoarthritis, left knee: Secondary | ICD-10-CM | POA: Diagnosis not present

## 2023-05-29 DIAGNOSIS — M1712 Unilateral primary osteoarthritis, left knee: Secondary | ICD-10-CM | POA: Diagnosis not present

## 2023-06-05 DIAGNOSIS — M1712 Unilateral primary osteoarthritis, left knee: Secondary | ICD-10-CM | POA: Diagnosis not present

## 2023-06-06 DIAGNOSIS — G4733 Obstructive sleep apnea (adult) (pediatric): Secondary | ICD-10-CM | POA: Diagnosis not present

## 2023-06-12 DIAGNOSIS — M1712 Unilateral primary osteoarthritis, left knee: Secondary | ICD-10-CM | POA: Diagnosis not present

## 2023-06-15 DIAGNOSIS — M1712 Unilateral primary osteoarthritis, left knee: Secondary | ICD-10-CM | POA: Diagnosis not present

## 2023-06-21 DIAGNOSIS — M1712 Unilateral primary osteoarthritis, left knee: Secondary | ICD-10-CM | POA: Diagnosis not present

## 2023-07-09 DIAGNOSIS — Z01818 Encounter for other preprocedural examination: Secondary | ICD-10-CM | POA: Diagnosis not present

## 2023-07-09 DIAGNOSIS — M19071 Primary osteoarthritis, right ankle and foot: Secondary | ICD-10-CM | POA: Diagnosis not present

## 2023-07-09 DIAGNOSIS — Z96661 Presence of right artificial ankle joint: Secondary | ICD-10-CM | POA: Diagnosis not present

## 2023-07-09 DIAGNOSIS — M216X1 Other acquired deformities of right foot: Secondary | ICD-10-CM | POA: Diagnosis not present

## 2023-07-09 DIAGNOSIS — E785 Hyperlipidemia, unspecified: Secondary | ICD-10-CM | POA: Diagnosis not present

## 2023-07-09 DIAGNOSIS — M25571 Pain in right ankle and joints of right foot: Secondary | ICD-10-CM | POA: Diagnosis not present

## 2023-07-09 DIAGNOSIS — M85671 Other cyst of bone, right ankle and foot: Secondary | ICD-10-CM | POA: Diagnosis not present

## 2023-07-13 DIAGNOSIS — G8918 Other acute postprocedural pain: Secondary | ICD-10-CM | POA: Diagnosis not present

## 2023-07-13 DIAGNOSIS — M19071 Primary osteoarthritis, right ankle and foot: Secondary | ICD-10-CM | POA: Diagnosis not present

## 2023-08-03 DIAGNOSIS — M19071 Primary osteoarthritis, right ankle and foot: Secondary | ICD-10-CM | POA: Diagnosis not present

## 2023-08-03 DIAGNOSIS — Z4789 Encounter for other orthopedic aftercare: Secondary | ICD-10-CM | POA: Diagnosis not present

## 2023-08-03 DIAGNOSIS — M85671 Other cyst of bone, right ankle and foot: Secondary | ICD-10-CM | POA: Diagnosis not present

## 2023-08-03 DIAGNOSIS — M216X1 Other acquired deformities of right foot: Secondary | ICD-10-CM | POA: Diagnosis not present

## 2023-08-27 DIAGNOSIS — M722 Plantar fascial fibromatosis: Secondary | ICD-10-CM | POA: Diagnosis not present

## 2023-08-27 DIAGNOSIS — M19071 Primary osteoarthritis, right ankle and foot: Secondary | ICD-10-CM | POA: Diagnosis not present

## 2023-09-11 DIAGNOSIS — R972 Elevated prostate specific antigen [PSA]: Secondary | ICD-10-CM | POA: Diagnosis not present

## 2023-09-20 DIAGNOSIS — D1801 Hemangioma of skin and subcutaneous tissue: Secondary | ICD-10-CM | POA: Diagnosis not present

## 2023-09-20 DIAGNOSIS — I781 Nevus, non-neoplastic: Secondary | ICD-10-CM | POA: Diagnosis not present

## 2023-09-20 DIAGNOSIS — L905 Scar conditions and fibrosis of skin: Secondary | ICD-10-CM | POA: Diagnosis not present

## 2023-09-20 DIAGNOSIS — H61191 Noninfective disorders of pinna, right ear: Secondary | ICD-10-CM | POA: Diagnosis not present

## 2023-09-20 DIAGNOSIS — L821 Other seborrheic keratosis: Secondary | ICD-10-CM | POA: Diagnosis not present

## 2023-09-20 DIAGNOSIS — L814 Other melanin hyperpigmentation: Secondary | ICD-10-CM | POA: Diagnosis not present

## 2023-09-20 DIAGNOSIS — L578 Other skin changes due to chronic exposure to nonionizing radiation: Secondary | ICD-10-CM | POA: Diagnosis not present

## 2023-09-24 DIAGNOSIS — Z4789 Encounter for other orthopedic aftercare: Secondary | ICD-10-CM | POA: Diagnosis not present

## 2023-09-24 DIAGNOSIS — M19071 Primary osteoarthritis, right ankle and foot: Secondary | ICD-10-CM | POA: Diagnosis not present

## 2023-10-08 DIAGNOSIS — H5213 Myopia, bilateral: Secondary | ICD-10-CM | POA: Diagnosis not present

## 2023-10-08 DIAGNOSIS — H2513 Age-related nuclear cataract, bilateral: Secondary | ICD-10-CM | POA: Diagnosis not present

## 2023-10-18 DIAGNOSIS — G4733 Obstructive sleep apnea (adult) (pediatric): Secondary | ICD-10-CM | POA: Diagnosis not present

## 2023-11-12 DIAGNOSIS — M216X1 Other acquired deformities of right foot: Secondary | ICD-10-CM | POA: Diagnosis not present

## 2023-11-12 DIAGNOSIS — M19071 Primary osteoarthritis, right ankle and foot: Secondary | ICD-10-CM | POA: Diagnosis not present

## 2023-11-12 DIAGNOSIS — Z96661 Presence of right artificial ankle joint: Secondary | ICD-10-CM | POA: Diagnosis not present

## 2024-01-10 DIAGNOSIS — M25571 Pain in right ankle and joints of right foot: Secondary | ICD-10-CM | POA: Diagnosis not present

## 2024-01-10 DIAGNOSIS — R2689 Other abnormalities of gait and mobility: Secondary | ICD-10-CM | POA: Diagnosis not present

## 2024-01-10 DIAGNOSIS — R269 Unspecified abnormalities of gait and mobility: Secondary | ICD-10-CM | POA: Diagnosis not present

## 2024-01-23 DIAGNOSIS — R2689 Other abnormalities of gait and mobility: Secondary | ICD-10-CM | POA: Diagnosis not present

## 2024-01-23 DIAGNOSIS — R269 Unspecified abnormalities of gait and mobility: Secondary | ICD-10-CM | POA: Diagnosis not present

## 2024-01-23 DIAGNOSIS — M25571 Pain in right ankle and joints of right foot: Secondary | ICD-10-CM | POA: Diagnosis not present

## 2024-02-06 DIAGNOSIS — R2689 Other abnormalities of gait and mobility: Secondary | ICD-10-CM | POA: Diagnosis not present

## 2024-02-06 DIAGNOSIS — M25571 Pain in right ankle and joints of right foot: Secondary | ICD-10-CM | POA: Diagnosis not present

## 2024-02-06 DIAGNOSIS — R269 Unspecified abnormalities of gait and mobility: Secondary | ICD-10-CM | POA: Diagnosis not present

## 2024-02-11 DIAGNOSIS — M216X1 Other acquired deformities of right foot: Secondary | ICD-10-CM | POA: Diagnosis not present

## 2024-02-11 DIAGNOSIS — M19071 Primary osteoarthritis, right ankle and foot: Secondary | ICD-10-CM | POA: Diagnosis not present

## 2024-02-11 DIAGNOSIS — M85671 Other cyst of bone, right ankle and foot: Secondary | ICD-10-CM | POA: Diagnosis not present

## 2024-02-11 DIAGNOSIS — Z96661 Presence of right artificial ankle joint: Secondary | ICD-10-CM | POA: Diagnosis not present

## 2024-02-13 DIAGNOSIS — R269 Unspecified abnormalities of gait and mobility: Secondary | ICD-10-CM | POA: Diagnosis not present

## 2024-02-13 DIAGNOSIS — R2689 Other abnormalities of gait and mobility: Secondary | ICD-10-CM | POA: Diagnosis not present

## 2024-02-13 DIAGNOSIS — M25571 Pain in right ankle and joints of right foot: Secondary | ICD-10-CM | POA: Diagnosis not present

## 2024-02-28 DIAGNOSIS — R269 Unspecified abnormalities of gait and mobility: Secondary | ICD-10-CM | POA: Diagnosis not present

## 2024-02-28 DIAGNOSIS — R2689 Other abnormalities of gait and mobility: Secondary | ICD-10-CM | POA: Diagnosis not present

## 2024-02-28 DIAGNOSIS — M25571 Pain in right ankle and joints of right foot: Secondary | ICD-10-CM | POA: Diagnosis not present

## 2024-03-06 DIAGNOSIS — W57XXXA Bitten or stung by nonvenomous insect and other nonvenomous arthropods, initial encounter: Secondary | ICD-10-CM | POA: Diagnosis not present

## 2024-03-06 DIAGNOSIS — R21 Rash and other nonspecific skin eruption: Secondary | ICD-10-CM | POA: Diagnosis not present

## 2024-06-05 DIAGNOSIS — B351 Tinea unguium: Secondary | ICD-10-CM | POA: Diagnosis not present

## 2024-06-05 DIAGNOSIS — L600 Ingrowing nail: Secondary | ICD-10-CM | POA: Diagnosis not present

## 2024-06-05 DIAGNOSIS — M792 Neuralgia and neuritis, unspecified: Secondary | ICD-10-CM | POA: Diagnosis not present
